# Patient Record
Sex: Female | Born: 1935 | Race: Black or African American | Hispanic: No | Smoking: Never smoker
Health system: Southern US, Community
[De-identification: ages and names within clinical notes are randomized; demographics above are authoritative.]

## PROBLEM LIST (undated history)

## (undated) DIAGNOSIS — K219 Gastro-esophageal reflux disease without esophagitis: Secondary | ICD-10-CM

## (undated) DIAGNOSIS — R4189 Other symptoms and signs involving cognitive functions and awareness: Secondary | ICD-10-CM

## (undated) DIAGNOSIS — I10 Essential (primary) hypertension: Secondary | ICD-10-CM

## (undated) DIAGNOSIS — C801 Malignant (primary) neoplasm, unspecified: Secondary | ICD-10-CM

## (undated) DIAGNOSIS — I2699 Other pulmonary embolism without acute cor pulmonale: Secondary | ICD-10-CM

## (undated) DIAGNOSIS — E119 Type 2 diabetes mellitus without complications: Secondary | ICD-10-CM

## (undated) HISTORY — PX: BREAST SURGERY: SHX581

---
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
## 2005-10-31 ENCOUNTER — Ambulatory Visit: Payer: Self-pay | Admitting: Internal Medicine

## 2005-11-12 ENCOUNTER — Ambulatory Visit: Payer: Self-pay | Admitting: Internal Medicine

## 2005-11-18 ENCOUNTER — Emergency Department: Payer: Self-pay | Admitting: Emergency Medicine

## 2005-11-18 ENCOUNTER — Other Ambulatory Visit: Payer: Self-pay

## 2006-02-13 ENCOUNTER — Ambulatory Visit: Payer: Self-pay | Admitting: Internal Medicine

## 2006-02-28 ENCOUNTER — Ambulatory Visit: Payer: Self-pay | Admitting: Internal Medicine

## 2006-03-12 ENCOUNTER — Ambulatory Visit: Payer: Self-pay | Admitting: Internal Medicine

## 2006-04-29 ENCOUNTER — Ambulatory Visit: Payer: Self-pay | Admitting: *Deleted

## 2006-05-12 ENCOUNTER — Ambulatory Visit: Payer: Self-pay | Admitting: *Deleted

## 2006-06-12 ENCOUNTER — Ambulatory Visit: Payer: Self-pay | Admitting: *Deleted

## 2006-06-25 ENCOUNTER — Ambulatory Visit: Payer: Self-pay | Admitting: Internal Medicine

## 2006-06-28 ENCOUNTER — Ambulatory Visit: Payer: Self-pay | Admitting: Internal Medicine

## 2006-07-13 ENCOUNTER — Ambulatory Visit: Payer: Self-pay | Admitting: *Deleted

## 2007-01-06 ENCOUNTER — Emergency Department: Payer: Self-pay | Admitting: Emergency Medicine

## 2007-01-11 ENCOUNTER — Ambulatory Visit: Payer: Self-pay | Admitting: Internal Medicine

## 2007-01-17 ENCOUNTER — Ambulatory Visit: Payer: Self-pay | Admitting: Internal Medicine

## 2007-02-11 ENCOUNTER — Ambulatory Visit: Payer: Self-pay | Admitting: Internal Medicine

## 2007-02-28 ENCOUNTER — Ambulatory Visit: Payer: Self-pay | Admitting: Internal Medicine

## 2007-03-09 ENCOUNTER — Inpatient Hospital Stay: Payer: Self-pay | Admitting: *Deleted

## 2007-03-13 ENCOUNTER — Ambulatory Visit: Payer: Self-pay | Admitting: Internal Medicine

## 2007-03-27 ENCOUNTER — Ambulatory Visit: Payer: Self-pay | Admitting: Obstetrics and Gynecology

## 2007-07-14 ENCOUNTER — Ambulatory Visit: Payer: Self-pay | Admitting: Internal Medicine

## 2007-07-16 ENCOUNTER — Ambulatory Visit: Payer: Self-pay | Admitting: Internal Medicine

## 2007-08-13 ENCOUNTER — Ambulatory Visit: Payer: Self-pay | Admitting: Internal Medicine

## 2007-09-04 ENCOUNTER — Ambulatory Visit: Payer: Self-pay | Admitting: Unknown Physician Specialty

## 2008-01-11 ENCOUNTER — Ambulatory Visit: Payer: Self-pay | Admitting: Internal Medicine

## 2008-02-11 ENCOUNTER — Ambulatory Visit: Payer: Self-pay | Admitting: Internal Medicine

## 2008-02-25 ENCOUNTER — Ambulatory Visit: Payer: Self-pay | Admitting: Unknown Physician Specialty

## 2008-03-01 ENCOUNTER — Ambulatory Visit: Payer: Self-pay | Admitting: Internal Medicine

## 2008-03-12 ENCOUNTER — Ambulatory Visit: Payer: Self-pay | Admitting: Internal Medicine

## 2008-07-13 ENCOUNTER — Ambulatory Visit: Payer: Self-pay | Admitting: Internal Medicine

## 2008-07-20 ENCOUNTER — Ambulatory Visit: Payer: Self-pay | Admitting: Internal Medicine

## 2008-08-12 ENCOUNTER — Ambulatory Visit: Payer: Self-pay | Admitting: Internal Medicine

## 2008-08-18 ENCOUNTER — Ambulatory Visit: Payer: Self-pay | Admitting: Otolaryngology

## 2008-08-22 IMAGING — CR DG CHEST 2V
1 series · 2 of 2 positions shown · non-contrast
Comparison: none

REASON FOR EXAM: CP - RM 4
COMMENTS:

[Series 1: view not recorded · 0.17mm/px · 2 of 2 slices shown]
[im 1/2]
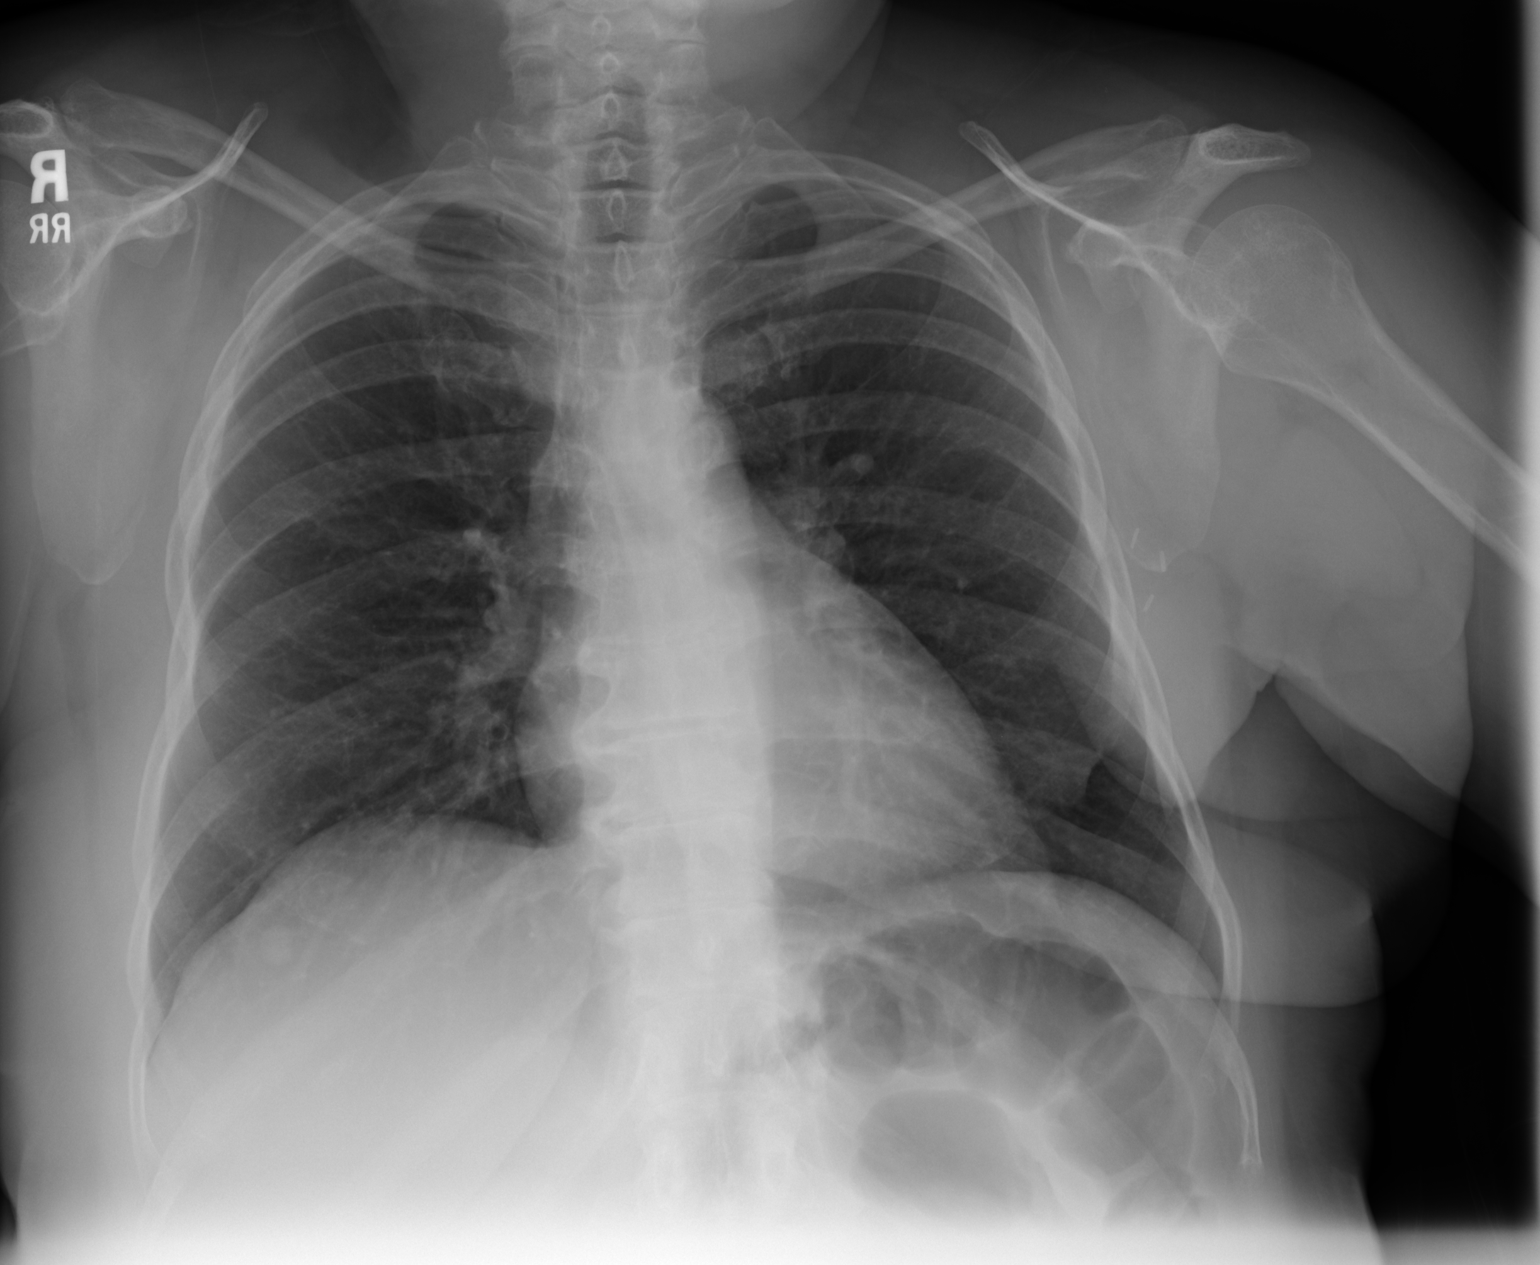
[im 2/2]
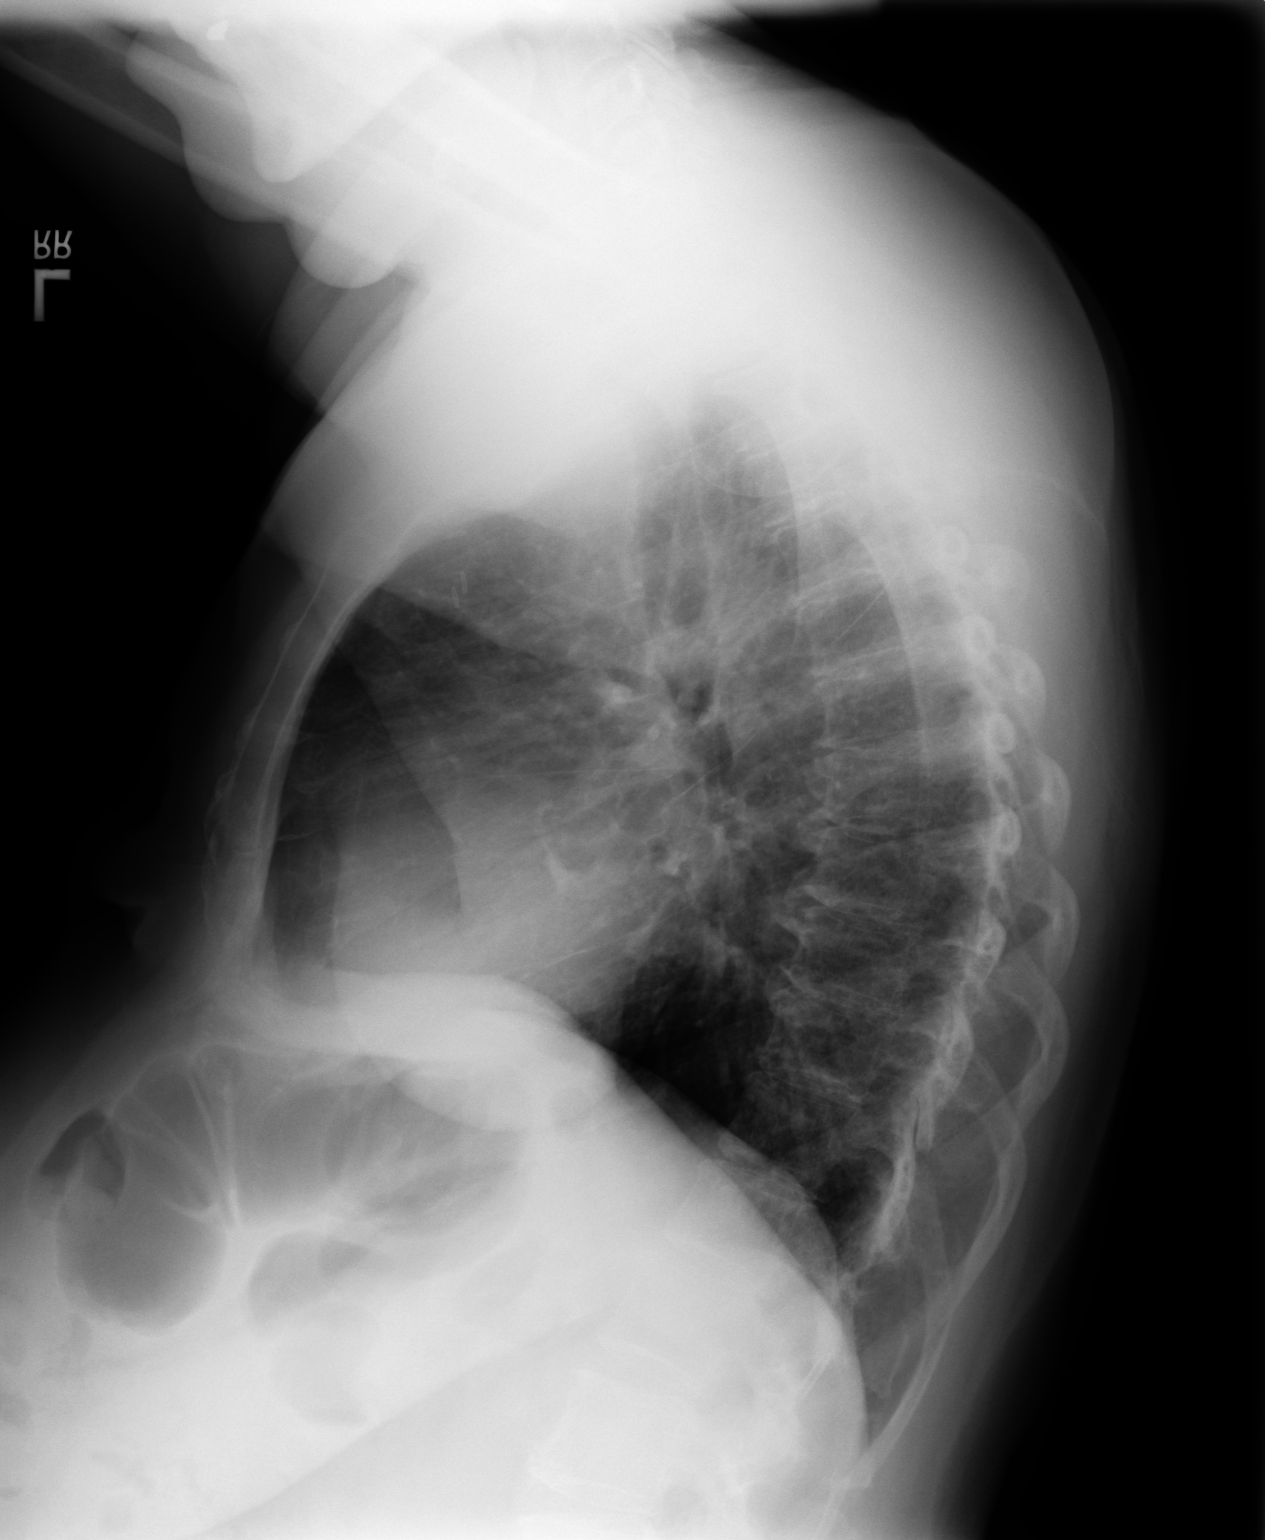

[2 of 2 positions shown; findings below may reference images not displayed]

PROCEDURE:     DXR - DXR CHEST PA (OR AP) AND LATERAL  - March 09, 2007 [DATE]

RESULT:     Comparison is made to prior study dated 11-19-05.

The lungs are clear. The cardiac silhouette and visualized bony skeleton are
unremarkable.  Chest radiograph without evidence of acute cardiopulmonary
disease.
IMPRESSION: 1)Unremarkable chest radiograph.

## 2009-01-10 ENCOUNTER — Ambulatory Visit: Payer: Self-pay | Admitting: Internal Medicine

## 2009-01-31 ENCOUNTER — Ambulatory Visit: Payer: Self-pay | Admitting: Internal Medicine

## 2009-02-10 ENCOUNTER — Ambulatory Visit: Payer: Self-pay | Admitting: Internal Medicine

## 2009-02-16 ENCOUNTER — Ambulatory Visit: Payer: Self-pay | Admitting: Family Medicine

## 2009-02-26 ENCOUNTER — Emergency Department: Payer: Self-pay | Admitting: Emergency Medicine

## 2009-03-09 ENCOUNTER — Ambulatory Visit: Payer: Self-pay | Admitting: Internal Medicine

## 2009-03-12 ENCOUNTER — Ambulatory Visit: Payer: Self-pay | Admitting: Internal Medicine

## 2009-07-13 ENCOUNTER — Ambulatory Visit: Payer: Self-pay | Admitting: Internal Medicine

## 2009-08-01 ENCOUNTER — Ambulatory Visit: Payer: Self-pay | Admitting: Internal Medicine

## 2009-08-12 ENCOUNTER — Ambulatory Visit: Payer: Self-pay | Admitting: Internal Medicine

## 2010-02-09 ENCOUNTER — Ambulatory Visit: Payer: Self-pay | Admitting: Family Medicine

## 2010-03-12 ENCOUNTER — Ambulatory Visit: Payer: Self-pay | Admitting: Internal Medicine

## 2010-03-13 ENCOUNTER — Ambulatory Visit: Payer: Self-pay | Admitting: Internal Medicine

## 2010-03-22 ENCOUNTER — Ambulatory Visit: Payer: Self-pay | Admitting: Internal Medicine

## 2010-04-12 ENCOUNTER — Ambulatory Visit: Payer: Self-pay | Admitting: Internal Medicine

## 2010-10-17 ENCOUNTER — Ambulatory Visit: Payer: Self-pay | Admitting: Allergy and Immunology

## 2013-03-25 ENCOUNTER — Ambulatory Visit: Payer: Self-pay | Admitting: Family Medicine

## 2013-06-11 ENCOUNTER — Ambulatory Visit: Payer: Self-pay | Admitting: Unknown Physician Specialty

## 2013-06-12 LAB — PATHOLOGY REPORT

## 2014-02-11 ENCOUNTER — Emergency Department: Payer: Self-pay | Admitting: Emergency Medicine

## 2014-07-01 ENCOUNTER — Ambulatory Visit: Payer: Self-pay | Admitting: Family Medicine

## 2016-02-27 ENCOUNTER — Emergency Department
Admission: EM | Admit: 2016-02-27 | Discharge: 2016-02-27 | Disposition: A | Discharge status: Home / Self Care | Payer: Medicare Other | Attending: Student | Admitting: Student

## 2016-02-27 ENCOUNTER — Encounter: Payer: Self-pay | Admitting: Emergency Medicine

## 2016-02-27 ENCOUNTER — Emergency Department: Payer: Medicare Other

## 2016-02-27 DIAGNOSIS — E119 Type 2 diabetes mellitus without complications: Secondary | ICD-10-CM | POA: Insufficient documentation

## 2016-02-27 DIAGNOSIS — R413 Other amnesia: Secondary | ICD-10-CM | POA: Insufficient documentation

## 2016-02-27 DIAGNOSIS — I10 Essential (primary) hypertension: Secondary | ICD-10-CM | POA: Insufficient documentation

## 2016-02-27 HISTORY — DX: Essential (primary) hypertension: I10

## 2016-02-27 HISTORY — DX: Gastro-esophageal reflux disease without esophagitis: K21.9

## 2016-02-27 HISTORY — DX: Type 2 diabetes mellitus without complications: E11.9

## 2016-02-27 LAB — URINALYSIS COMPLETE WITH MICROSCOPIC (ARMC ONLY)
BACTERIA UA: NONE SEEN
Bilirubin Urine: NEGATIVE
Glucose, UA: >500 mg/dL — AB
HGB URINE DIPSTICK: NEGATIVE
NITRITE: NEGATIVE
PH: 5 (ref 5.0–8.0)
PROTEIN: 30 mg/dL — AB
SPECIFIC GRAVITY, URINE: 1.024 (ref 1.005–1.030)

## 2016-02-27 LAB — BASIC METABOLIC PANEL
ANION GAP: 6 (ref 5–15)
BUN: 17 mg/dL (ref 6–20)
CALCIUM: 8.9 mg/dL (ref 8.9–10.3)
CO2: 26 mmol/L (ref 22–32)
Chloride: 105 mmol/L (ref 101–111)
Creatinine, Ser: 0.88 mg/dL (ref 0.44–1.00)
GFR calc Af Amer: >60 mL/min (ref >60)
GFR calc non Af Amer: >60 mL/min (ref >60)
GLUCOSE: 266 mg/dL — AB (ref 65–99)
Potassium: 3.7 mmol/L (ref 3.5–5.1)
Sodium: 137 mmol/L (ref 135–145)

## 2016-02-27 LAB — CBC
HCT: 37.3 % (ref 35.0–47.0)
HEMOGLOBIN: 12.2 g/dL (ref 12.0–16.0)
MCH: 25.8 pg — AB (ref 26.0–34.0)
MCHC: 32.7 g/dL (ref 32.0–36.0)
MCV: 78.8 fL — ABNORMAL LOW (ref 80.0–100.0)
Platelets: 185 10*3/uL (ref 150–440)
RBC: 4.73 MIL/uL (ref 3.80–5.20)
RDW: 15.9 % — ABNORMAL HIGH (ref 11.5–14.5)
WBC: 6.1 10*3/uL (ref 3.6–11.0)

## 2016-02-27 NOTE — ED Notes (Signed)
Pt presents with daughter which is from out of town. Daughter reports that her mother has not been taking her meds as directed and is afraid that she may be developing some memory loss. Pt denies any chest pain and has no other symptoms at this time. Pt a/ox3 in triage. No acute distress noted.

## 2016-02-27 NOTE — ED Provider Notes (Signed)
[name] Provider Note  ____________________________________________  Time seen: Approximately 1:00 PM  I have reviewed the triage vital signs and the nursing notes.   HISTORY  Chief Complaint Memory Loss    HPI Dana Kelley is a 82 y.o. female issue of diabetes, hypertension, GERD, hypothyroidism, hyperlipidemia who presents for evaluation of greater than one month of memory difficulties, gradual onset, intermittent, currently mild to moderate, no modifying factors. The patient and her daughter at bedside are concerned that she may have early dementia and brought her to the ER so that she could be evaluated. Patient reports that she has had difficulty remembering things as of late, she will be driving and become confused about her intended destination. She has stopped taking all her medications because she "can't remember to take them". No chest pain, difficulty breathing, coughing, sneezing, runny nose, congestion, vomiting, diarrhea, fevers or chills. No headaches, vision changes, numbness or weakness. She does have a family history of dementia.   Past Medical History  Diagnosis Date  . Diabetes mellitus without complication ([place])   . Hypertension   . GERD (gastroesophageal reflux disease)     There are no active problems to display for this patient.   History reviewed. No pertinent past surgical history.  No current outpatient prescriptions on file.  Allergies Review of patient's allergies indicates no known allergies.  No family history on file.  Social History Social History  Substance Use Topics  . Smoking status: Never Smoker   . Smokeless tobacco: None  . Alcohol Use: No    Review of Systems Constitutional: No fever/chills Eyes: No visual changes. ENT: No sore throat. Cardiovascular: Denies chest pain. Respiratory: Denies shortness of breath. Gastrointestinal: No abdominal pain.  No nausea, no vomiting.  No  diarrhea.  No constipation. Genitourinary: Negative for dysuria. Musculoskeletal: Negative for back pain. Skin: Negative for rash. Neurological: Negative for headaches, focal weakness or numbness.  10-point ROS otherwise negative.  ____________________________________________   PHYSICAL EXAM:  VITAL SIGNS: ED Triage Vitals  Enc Vitals Group     BP 02/27/16 1121 118/98 mmHg     Pulse Rate 02/27/16 1121 78     Resp 02/27/16 1121 20     Temp 02/27/16 1121 98.6 F (37 C)     Temp Source 02/27/16 1121 Oral     SpO2 02/27/16 1121 98 %     Weight 02/27/16 1122 200 lb (90.719 kg)     Height 02/27/16 1122 5\' 1"  (1.549 m)     Head Cir --      Peak Flow --      Pain Score --      Pain Loc --      Pain Edu? --      Excl. in [place]? --     Constitutional: Alert and oriented x 3. Well appearing and in no acute distress. Eyes: Conjunctivae are normal. PERRL. EOMI. Head: Atraumatic. Nose: No congestion/rhinnorhea. Mouth/Throat: Mucous membranes are moist.  Oropharynx non-erythematous. Neck: No stridor.  Supple without meningismus. Cardiovascular: Normal rate, regular rhythm. Grossly normal heart sounds.  Good peripheral circulation. Respiratory: Normal respiratory effort.  No retractions. Lungs CTAB. Gastrointestinal: Soft and nontender. No distention.  No CVA tenderness. Genitourinary: deferred Musculoskeletal: No lower extremity tenderness nor edema.  No joint effusions. Neurologic:  Normal speech and language. No gross focal neurologic deficits are appreciated. No gait instability. 5 out of 5 strength in bilateral upper and lower extremities, sensation intact to light touch throughout, cranial  nerves II through XII intact, normal finger-nose-finger. Skin:  Skin is warm, dry and intact. No rash noted. Psychiatric: Mood and affect are normal. Speech and behavior are normal.  ____________________________________________   LABS (all labs ordered are listed, but only abnormal results are  displayed)  Labs Reviewed  BASIC METABOLIC PANEL - Abnormal; Notable for the following:    Glucose, Bld 266 (*)    All other components within normal limits  CBC - Abnormal; Notable for the following:    MCV 78.8 (*)    MCH 25.8 (*)    RDW 15.9 (*)    All other components within normal limits  URINALYSIS COMPLETEWITH MICROSCOPIC ([facility] ONLY) - Abnormal; Notable for the following:    Color, Urine YELLOW (*)    APPearance CLEAR (*)    Glucose, UA >500 (*)    Ketones, ur TRACE (*)    Protein, ur 30 (*)    Leukocytes, UA TRACE (*)    Squamous Epithelial / LPF 6-30 (*)    All other components within normal limits   ____________________________________________  EKG  ED ECG REPORT I, Joanne Gavel, the attending physician, personally viewed and interpreted this ECG.   Date: 02/27/2016  EKG Time: 11:35  Rate: 68  Rhythm: normal sinus rhythm  Axis: normal  Intervals:none  ST&T Change: No acute ST elevation. Q waves in 1, 2, aVL, aVF, V2, V3.  ____________________________________________  RADIOLOGY  CT head IMPRESSION: Mild chronic atrophy and ischemic changes. No acute abnormality is noted. ____________________________________________   PROCEDURES  Procedure(s) performed: None  Critical Care performed: No  ____________________________________________   INITIAL IMPRESSION / ASSESSMENT AND PLAN / ED COURSE  Pertinent labs & imaging results that were available during my care of the patient were reviewed by me and considered in my medical decision making (see chart for details).  Dana Kelley is a 82 y.o. female issue of diabetes, hypertension, GERD, hypothyroidism, hyperlipidemia who presents for evaluation of greater than one month of memory difficulties, gradual onset, intermittent, currently mild to moderate, no modifying factors.  On exam, she is well-appearing and in no acute distress. Vital signs are stable, she is afebrile. She has an intact neurological  examination, she is awake, alert and oriented. EKG not consistent with acute ischemia. Basic labs and urinalysis are generally unremarkable. CT head shows no acute intracranial abnormality. I discussed with the patient as well as her daughter at bedside that the diagnosis of dementia cannot be made during one ER visit. We discussed return precautions, need for close PCP follow-up as well as the need for possible evaluation by neurocognitive specialist. They voice understanding and are comfortable with the discharge plan. DC home.  ____________________________________________   FINAL CLINICAL IMPRESSION(S) / ED DIAGNOSES  Final diagnoses:  Memory loss      Joanne Gavel, MD 02/27/16 1425

## 2017-08-12 IMAGING — CT CT HEAD W/O CM
1 series · 16 of 30 positions shown, 20 images · non-contrast
Comparison: None.

CLINICAL DATA: Left leg weakness

EXAM:
CT HEAD WITHOUT CONTRAST
TECHNIQUE: Contiguous axial images were obtained from the base of the skull
through the vertex without intravenous contrast.

[Series 2: head wo · axial · 0.47mm/px · z∈[-138,-5]mm · 16 of 30 slices shown, 20 images]
[im 2/30  brain]
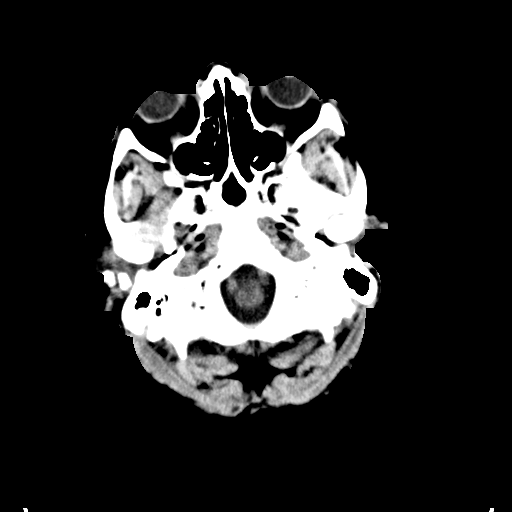
[im 2/30  bone]
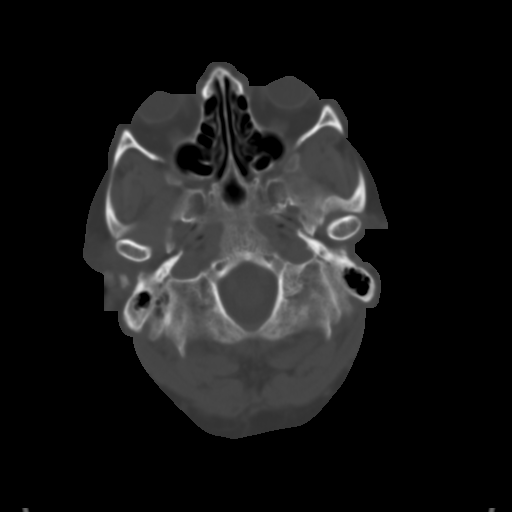
[im 4/30  brain]
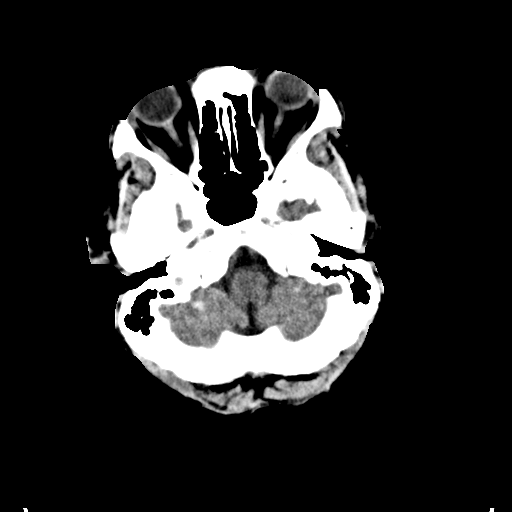
[im 6/30  brain]
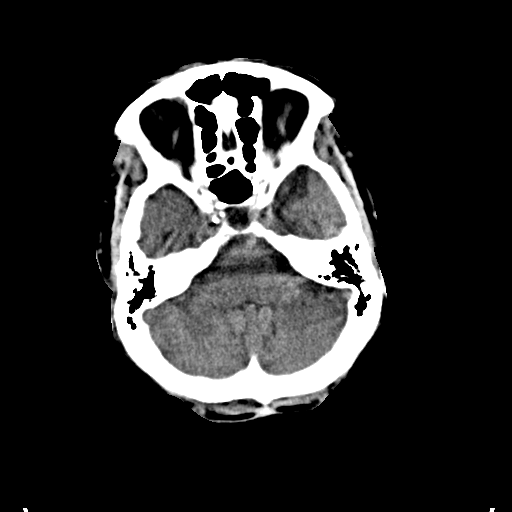
[im 8/30  brain]
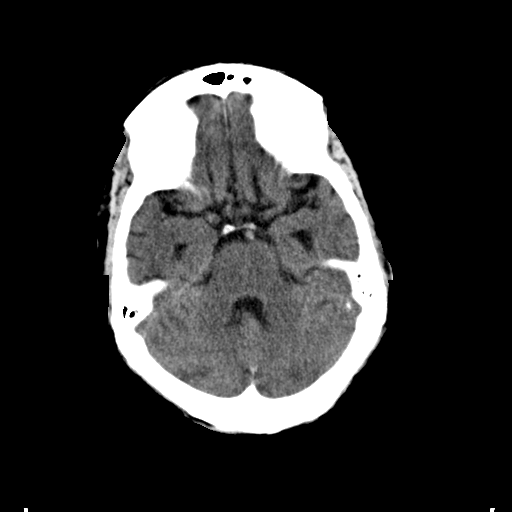
[im 9/30  brain]
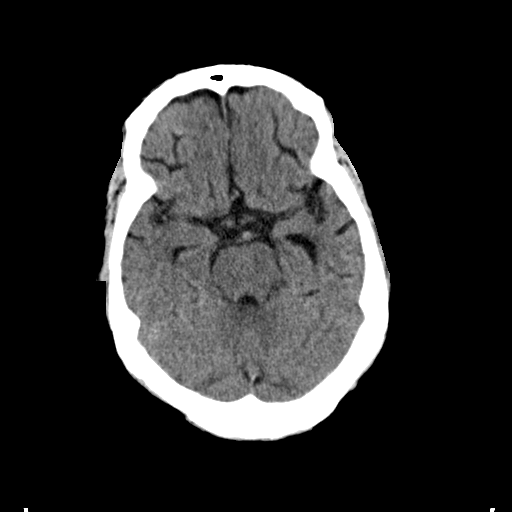
[im 9/30  bone]
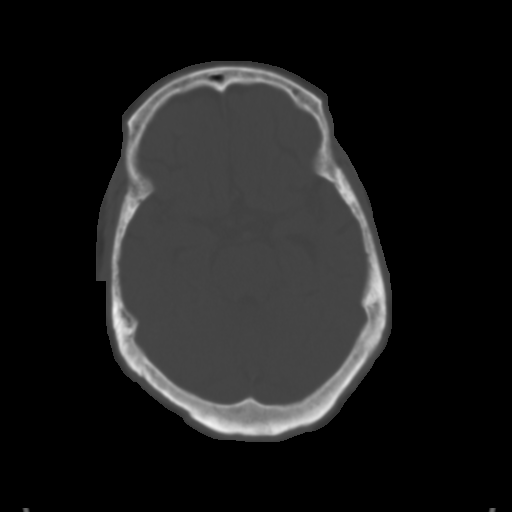
[im 11/30  brain]
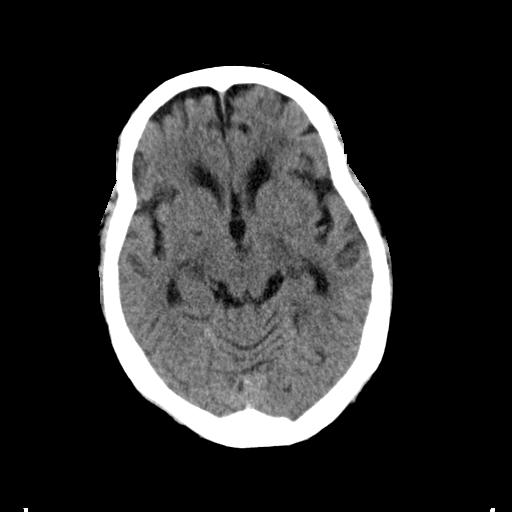
[im 13/30  brain]
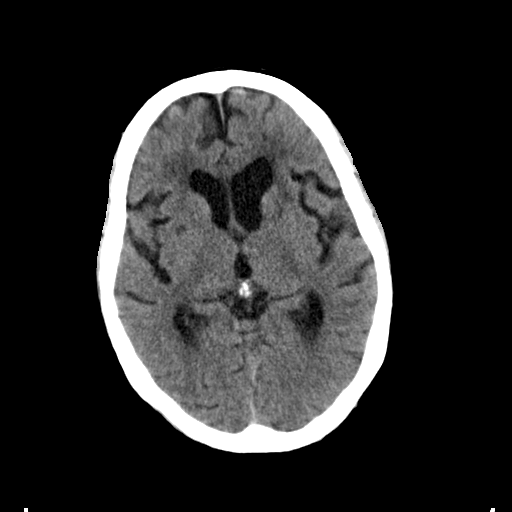
[im 15/30  brain]
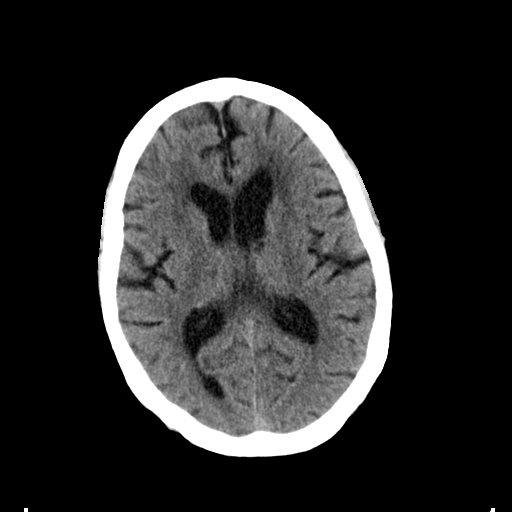
[im 16/30  brain]
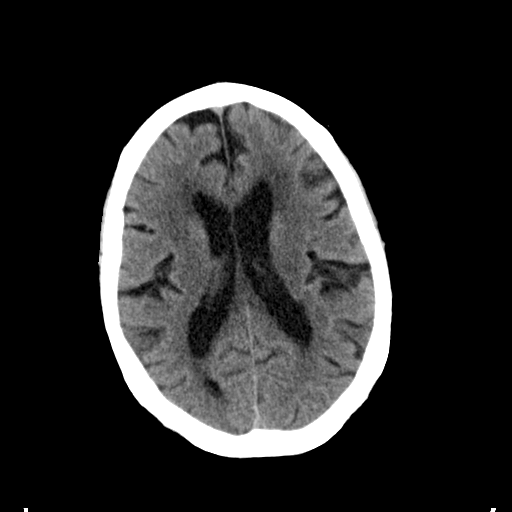
[im 16/30  bone]
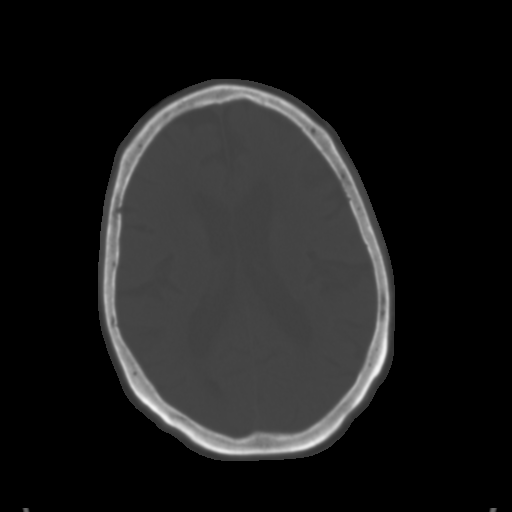
[im 18/30  brain]
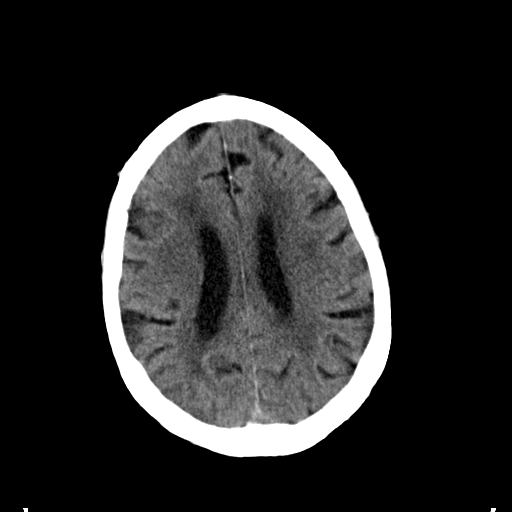
[im 20/30  brain]
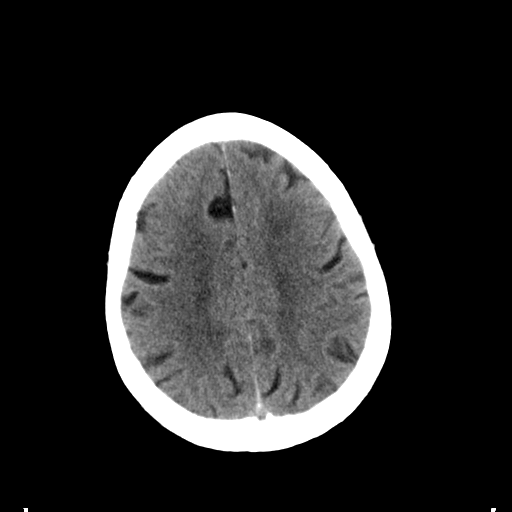
[im 22/30  brain]
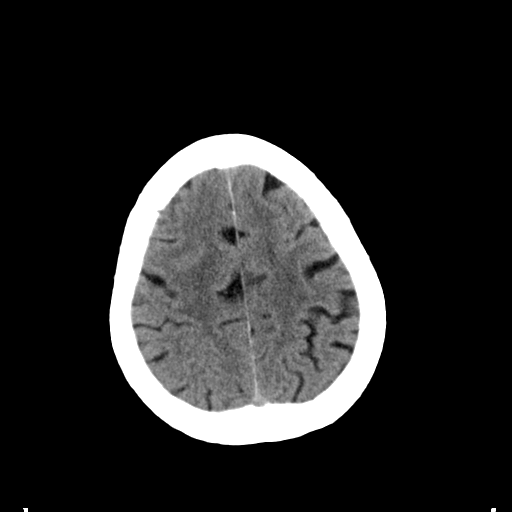
[im 23/30  brain]
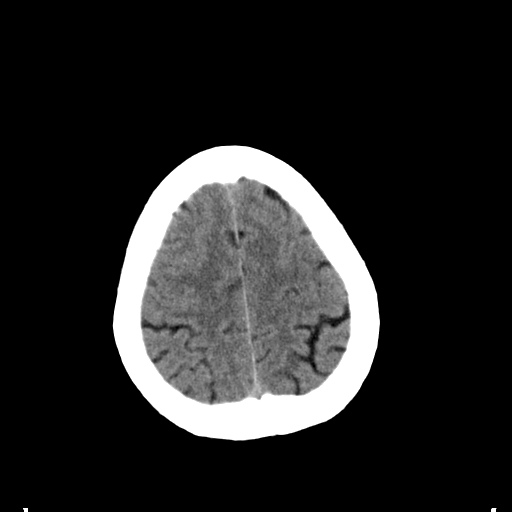
[im 23/30  bone]
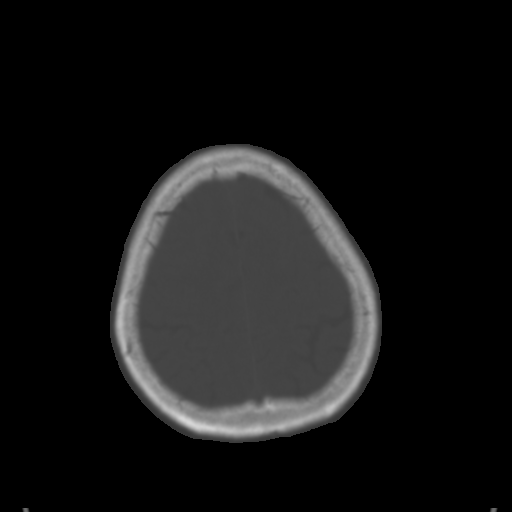
[im 25/30  brain]
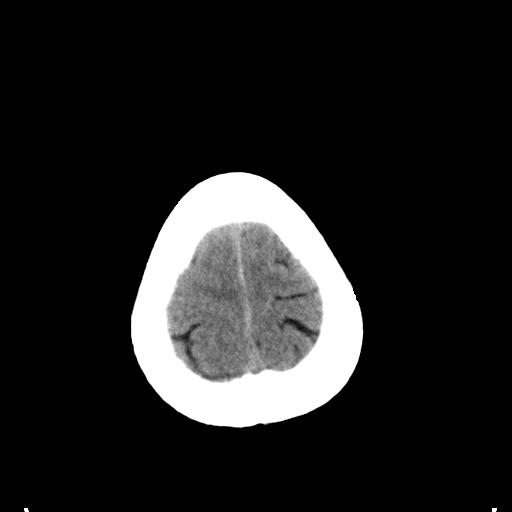
[im 27/30  brain]
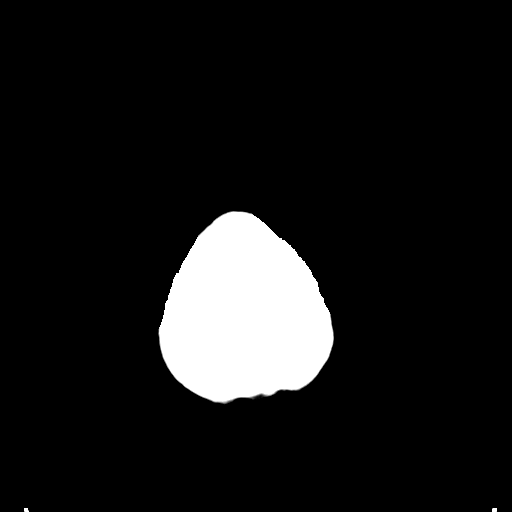
[im 29/30  brain]
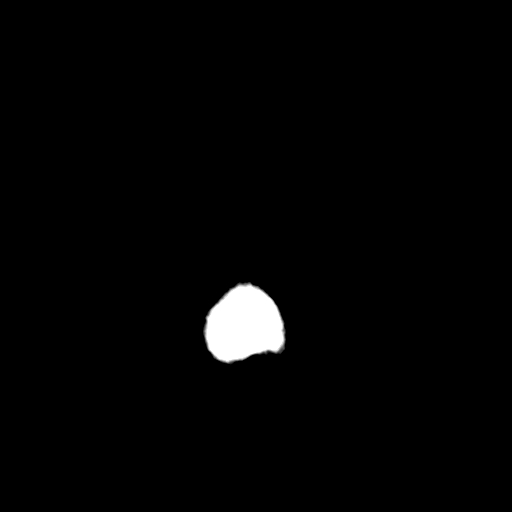

[16 of 30 positions shown; findings below may reference images not displayed]

FINDINGS: The bony calvarium is intact. Mild atrophic changes and chronic
white matter ischemic change is noted. Small lacunar infarcts is
noted on the right in the subinsular region. No findings to suggest
acute hemorrhage, acute infarction or space-occupying mass lesion
are noted.
IMPRESSION: Mild chronic atrophy and ischemic changes. No acute abnormality is
noted.

## 2019-02-11 DIAGNOSIS — I2699 Other pulmonary embolism without acute cor pulmonale: Secondary | ICD-10-CM

## 2019-02-11 HISTORY — DX: Other pulmonary embolism without acute cor pulmonale: I26.99

## 2019-06-20 ENCOUNTER — Emergency Department: Payer: Medicare Other

## 2019-06-20 ENCOUNTER — Emergency Department
Admission: EM | Admit: 2019-06-20 | Discharge: 2019-06-20 | Disposition: A | Discharge status: Home / Self Care | Payer: Medicare Other | Attending: Emergency Medicine | Admitting: Emergency Medicine

## 2019-06-20 ENCOUNTER — Other Ambulatory Visit: Payer: Self-pay

## 2019-06-20 ENCOUNTER — Encounter: Payer: Self-pay | Admitting: Emergency Medicine

## 2019-06-20 DIAGNOSIS — IMO0001 Reserved for inherently not codable concepts without codable children: Secondary | ICD-10-CM

## 2019-06-20 DIAGNOSIS — I10 Essential (primary) hypertension: Secondary | ICD-10-CM | POA: Diagnosis not present

## 2019-06-20 DIAGNOSIS — Y929 Unspecified place or not applicable: Secondary | ICD-10-CM | POA: Insufficient documentation

## 2019-06-20 DIAGNOSIS — R2242 Localized swelling, mass and lump, left lower limb: Secondary | ICD-10-CM | POA: Diagnosis present

## 2019-06-20 DIAGNOSIS — S93402A Sprain of unspecified ligament of left ankle, initial encounter: Secondary | ICD-10-CM | POA: Insufficient documentation

## 2019-06-20 DIAGNOSIS — Y999 Unspecified external cause status: Secondary | ICD-10-CM | POA: Insufficient documentation

## 2019-06-20 DIAGNOSIS — X58XXXA Exposure to other specified factors, initial encounter: Secondary | ICD-10-CM | POA: Insufficient documentation

## 2019-06-20 DIAGNOSIS — M25472 Effusion, left ankle: Secondary | ICD-10-CM

## 2019-06-20 DIAGNOSIS — E119 Type 2 diabetes mellitus without complications: Secondary | ICD-10-CM | POA: Insufficient documentation

## 2019-06-20 DIAGNOSIS — Y939 Activity, unspecified: Secondary | ICD-10-CM | POA: Diagnosis not present

## 2019-06-20 HISTORY — DX: Malignant (primary) neoplasm, unspecified: C80.1

## 2019-06-20 HISTORY — DX: Other symptoms and signs involving cognitive functions and awareness: R41.89

## 2019-06-20 MED ORDER — ACETAMINOPHEN 500 MG PO TABS: 1000.0000 mg | ORAL_TABLET | Freq: Once | ORAL | Status: DC

## 2019-06-20 NOTE — Discharge Instructions (Addendum)
IT IS VERY IMPORTANT THAT YOU WALK, OR AT LEAST MOVE YOUR LEGS, AS OFTEN AS POSSIBLE, AT LEAST ONCE EVERY 1-2 HOURS  DOING THIS WILL HELP WITH SWELLING, IMPROVE PAIN, AND PREVENT BLOOD CLOTS  FOLLOW-UP WITH YOUR REGULAR DOCTOR IN 1 WEEK FOR REPEAT EXAM AND IMAGING  TAKE OVER-THE-COUNTER TYLENOL AND ADVIL AS NEEDED FOR PAIN  YOU CAN APPLY ICE AS NEEDED FOR SWELLING

## 2019-06-20 NOTE — ED Triage Notes (Signed)
Pt to ED from home c/o left leg swelling, dx PE back in April and placed on elequis.  Seen at [facility] today but sent here for [place] of left leg.  States pain to left ankle that is swollen but denies SOB.  Speaking in complete and coherent sentences, chest rise even and unlabored, in NAD at this time.

## 2019-06-20 NOTE — ED Notes (Signed)
First Nurse Note: Pt to ED from [facility] for evaluation of DVT. Pt has hx/o Dementia. Pt Dtr present with her.

## 2019-06-20 NOTE — ED Provider Notes (Signed)
[facility] Emergency Department Provider Note  ____________________________________________   First MD Initiated Contact with Patient 06/20/19 1609     (approximate)  I have reviewed the triage vital signs and the nursing notes.   HISTORY  Chief Complaint Leg Swelling    HPI Dana Kelley is a 85 y.o. female  Here with left ankle swelling. Per pt's report, she has had increasing L ankle pain and swelling for the last week. Lives with her daughter who is here. Per family report, pt has a known R meniscal injury, but has been c/o increasing L ankle pain x 1-2 weeks. She's been less mobile and they believe this was the cause, and the swelling does improve w/ elevation. However, pt was c/o increased pain todasy so they went to UC and were sent here. No known trauma. No redness or skin break down or wounds. No other complaints. She uses a cane occasionally. No fever, chills. No h/o DVT. No popliteal TTP.        Past Medical History:  Diagnosis Date  . Cancer ([place])    Breast (left)  . Cognitive decline   . Diabetes mellitus without complication ([place])   . GERD (gastroesophageal reflux disease)   . Hypertension   . Pulmonary embolism ([place]) 02/2019    There are no active problems to display for this patient.   Past Surgical History:  Procedure Laterality Date  . BREAST SURGERY Left     Prior to Admission medications   Not on File    Allergies Januvia [sitagliptin]  History reviewed. No pertinent family history.  Social History Social History   Tobacco Use  . Smoking status: Never Smoker  . Smokeless tobacco: Never Used  Substance Use Topics  . Alcohol use: No  . Drug use: Never    Review of Systems  Review of Systems  Constitutional: Negative for fatigue and fever.  HENT: Negative for congestion and sore throat.   Eyes: Negative for visual disturbance.  Respiratory: Negative for cough and shortness of breath.   Cardiovascular:  Negative for chest pain.  Gastrointestinal: Negative for abdominal pain, diarrhea, nausea and vomiting.  Genitourinary: Negative for flank pain.  Musculoskeletal: Positive for arthralgias and myalgias. Negative for back pain and neck pain.  Skin: Negative for rash and wound.  Neurological: Negative for weakness.  All other systems reviewed and are negative.    ____________________________________________  PHYSICAL EXAM:      VITAL SIGNS: ED Triage Vitals  Enc Vitals Group     BP 06/20/19 1320 (!) 127/57     Pulse Rate 06/20/19 1320 (!) 57     Resp 06/20/19 1320 15     Temp 06/20/19 1320 99.9 F (37.7 C)     Temp Source 06/20/19 1320 Oral     SpO2 06/20/19 1320 100 %     Weight 06/20/19 1359 159 lb (72.1 kg)     Height 06/20/19 1359 5' (1.524 m)     Head Circumference --      Peak Flow --      Pain Score 06/20/19 1358 2     Pain Loc --      Pain Edu? --      Excl. in [place]? --      Physical Exam Vitals signs and nursing note reviewed.  Constitutional:      General: She is not in acute distress.    Appearance: She is well-developed.  HENT:     Head: Normocephalic and atraumatic.  Eyes:     Conjunctiva/sclera: Conjunctivae normal.  Neck:     Musculoskeletal: Neck supple.  Cardiovascular:     Rate and Rhythm: Normal rate and regular rhythm.     Heart sounds: Normal heart sounds. No murmur. No friction rub.  Pulmonary:     Effort: Pulmonary effort is normal. No respiratory distress.     Breath sounds: Normal breath sounds. No wheezing or rales.  Abdominal:     General: There is no distension.     Palpations: Abdomen is soft.     Tenderness: There is no abdominal tenderness.  Skin:    General: Skin is warm.     Capillary Refill: Capillary refill takes less than 2 seconds.  Neurological:     Mental Status: She is alert and oriented to person, place, and time.     Motor: No abnormal muscle tone.      LOWER EXTREMITY EXAM: LEFT  INSPECTION & PALPATION: Moderate  edema of L ankle, no open wounds. Moderate bruising noted to lateral ankle.  SENSORY: sensation is intact to light touch in:  Superficial peroneal nerve distribution (over dorsum of foot) Deep peroneal nerve distribution (over first dorsal web space) Sural nerve distribution (over lateral aspect 5th metatarsal) Saphenous nerve distribution (over medial instep)  MOTOR:  + Motor EHL (great toe dorsiflexion) + FHL (great toe plantar flexion)  + TA (ankle dorsiflexion)  + GSC (ankle plantar flexion)  VASCULAR: 2+ dorsalis pedis and posterior tibialis pulses Capillary refill < 2 sec, toes warm and well-perfused  COMPARTMENTS: Soft, warm, well-perfused No pain with passive extension No parethesias    ____________________________________________   LABS (all labs ordered are listed, but only abnormal results are displayed)  Labs Reviewed - No data to display  ____________________________________________  EKG: None ________________________________________  RADIOLOGY All imaging, including plain films, CT scans, and ultrasounds, independently reviewed by me, and interpretations confirmed via formal radiology reads.  ED MD interpretation:   U/S: No DVT Plain films: soft tissue swelling, no fx  Official radiology report(s): Dg Ankle Complete Left  Result Date: 06/20/2019 CLINICAL DATA:  Pain without trauma.  Swelling. EXAM: LEFT ANKLE COMPLETE - 3+ VIEW COMPARISON:  None. FINDINGS: There is bilateral soft tissue swelling in the ankle. No fracture or dislocation identified. Mild degenerative changes are noted. IMPRESSION: Mild degenerative changes. Soft tissue swelling. No fractures or bony lesions identified. No bony erosion. Electronically Signed   By: Dorise Bullion III M.D   On: 06/20/2019 17:06   US Venous Img Lower Unilateral Left  Result Date: 06/20/2019 CLINICAL DATA:  Left ankle edema. EXAM: LEFT LOWER EXTREMITY VENOUS DOPPLER ULTRASOUND TECHNIQUE: Gray-scale sonography  with graded compression, as well as color Doppler and duplex ultrasound were performed to evaluate the lower extremity deep venous systems from the level of the common femoral vein and including the common femoral, femoral, profunda femoral, popliteal and calf veins including the posterior tibial, peroneal and gastrocnemius veins when visible. The superficial great saphenous vein was also interrogated. Spectral Doppler was utilized to evaluate flow at rest and with distal augmentation maneuvers in the common femoral, femoral and popliteal veins. COMPARISON:  None. FINDINGS: Contralateral Common Femoral Vein: Respiratory phasicity is normal and symmetric with the symptomatic side. No evidence of thrombus. Normal compressibility. Common Femoral Vein: No evidence of thrombus. Normal compressibility, respiratory phasicity and response to augmentation. Saphenofemoral Junction: No evidence of thrombus. Normal compressibility and flow on color Doppler imaging. Profunda Femoral Vein: No evidence of thrombus. Normal compressibility and flow  on color Doppler imaging. Femoral Vein: No evidence of thrombus. Normal compressibility, respiratory phasicity and response to augmentation. Popliteal Vein: No evidence of thrombus. Normal compressibility, respiratory phasicity and response to augmentation. Calf Veins: No evidence of thrombus. Normal compressibility and flow on color Doppler imaging. Superficial Great Saphenous Vein: No evidence of thrombus. Normal compressibility. Venous Reflux:  None. Other Findings: No evidence of superficial thrombophlebitis or abnormal fluid collection. IMPRESSION: No evidence of left lower extremity deep venous thrombosis. Electronically Signed   By: Aletta Edouard M.D.   On: 06/20/2019 14:55    ____________________________________________  PROCEDURES   Procedure(s) performed (including Critical Care):  Procedures  ____________________________________________  INITIAL IMPRESSION / MDM  / [place] / ED COURSE  As part of my medical decision making, I reviewed the following data within the electronic MEDICAL RECORD NUMBER Notes from prior ED visits and [place] Controlled Substance Database      *AERIAL DILLEY was evaluated in Emergency Department on 06/20/2019 for the symptoms described in the history of present illness. She was evaluated in the context of the global COVID-19 pandemic, which necessitated consideration that the patient might be at risk for infection with the SARS-CoV-2 virus that causes COVID-19. Institutional protocols and algorithms that pertain to the evaluation of patients at risk for COVID-19 are in a state of rapid change based on information released by regulatory bodies including the CDC and federal and state organizations. These policies and algorithms were followed during the patient's care in the ED.  Some ED evaluations and interventions may be delayed as a result of limited staffing during the pandemic.*   Clinical Course as of Jun 19 1722  Sat Jun 20, 2019  1653 85 yo F here with L ankle pain and swelling. Plain film neg, u/S neg for DVT. Distal pulses and neurological status are fully intact. I suspect she likely has ankle sprain 2/2 gait changes from walking due to R meniscal injury, which is known. Stirrup splint applied, importance of ambulating reiterated, d/c with tylenol, outpt follow-up.   [CI]    Clinical Course User Index [CI] Duffy Bruce, MD    Medical Decision Making: As above  ____________________________________________  FINAL CLINICAL IMPRESSION(S) / ED DIAGNOSES  Final diagnoses:  First degree ankle sprain, left, initial encounter  Left ankle swelling     MEDICATIONS GIVEN DURING THIS VISIT:  Medications  acetaminophen (TYLENOL) tablet 1,000 mg (has no administration in time range)     ED Discharge Orders    None       Note:  This document was prepared using Dragon voice recognition software and may include  unintentional dictation errors.   Duffy Bruce, MD 06/20/19 1723

## 2020-12-03 IMAGING — US VENOUS DOPPLER ULTRASOUND OF LEFT LOWER EXTREMITY
1 series · 13 of 24 positions shown · non-contrast
Comparison: None.

CLINICAL DATA: Left ankle edema.



[Series 1: venous doppler ultrasound of left lower extremity · 0.07mm/px · 13 of 34 slices shown]
[im 1/34]
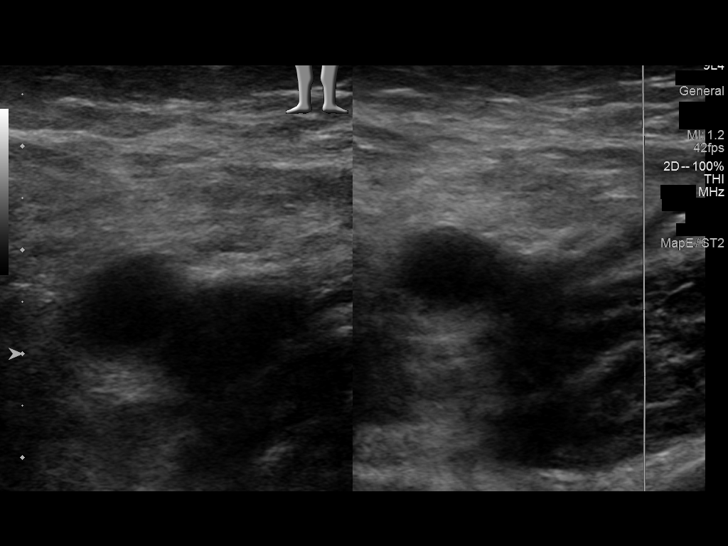
[im 3/34]
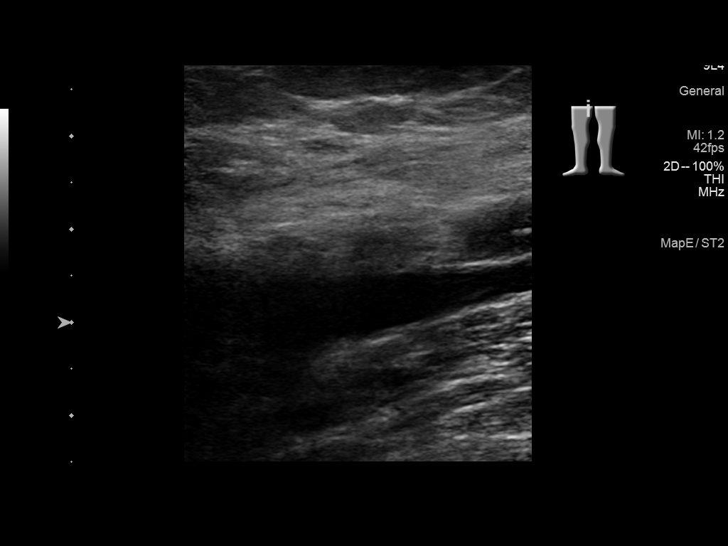
[im 6/34]
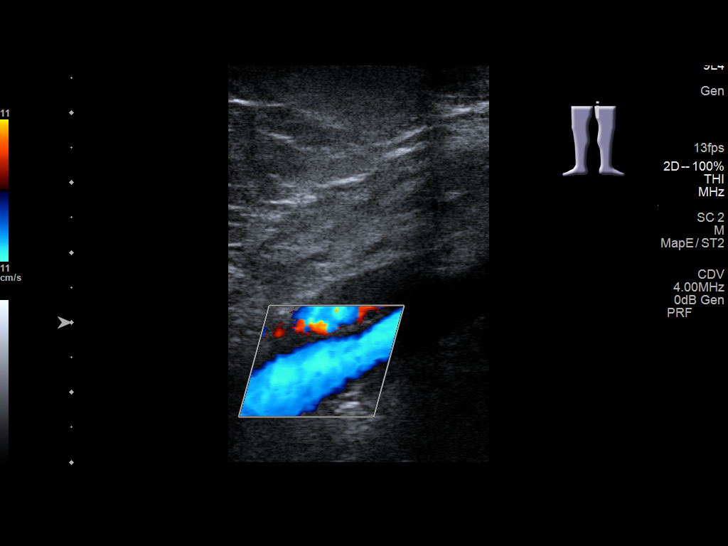
[im 9/34]
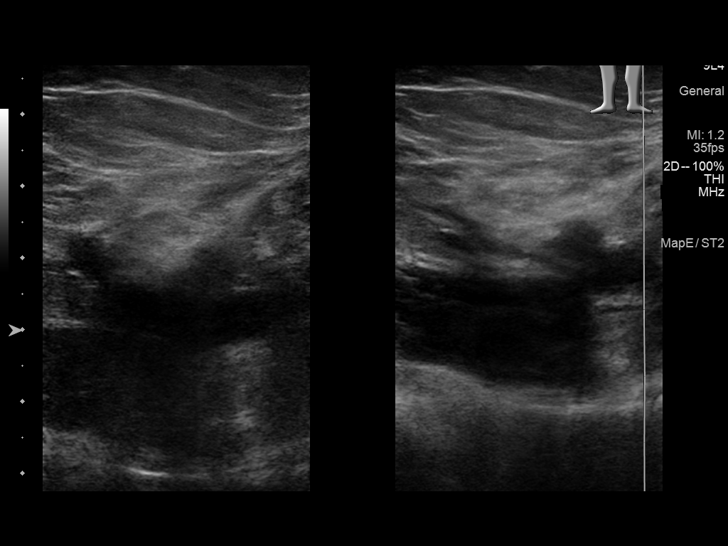
[im 12/34]
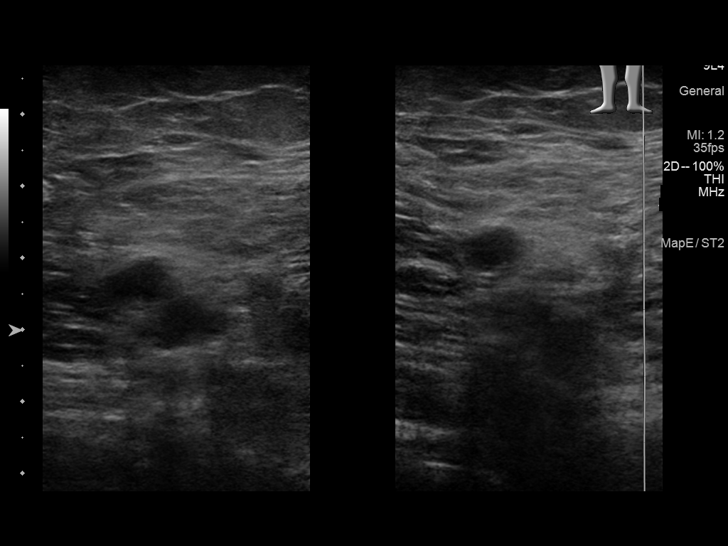
[im 15/34]
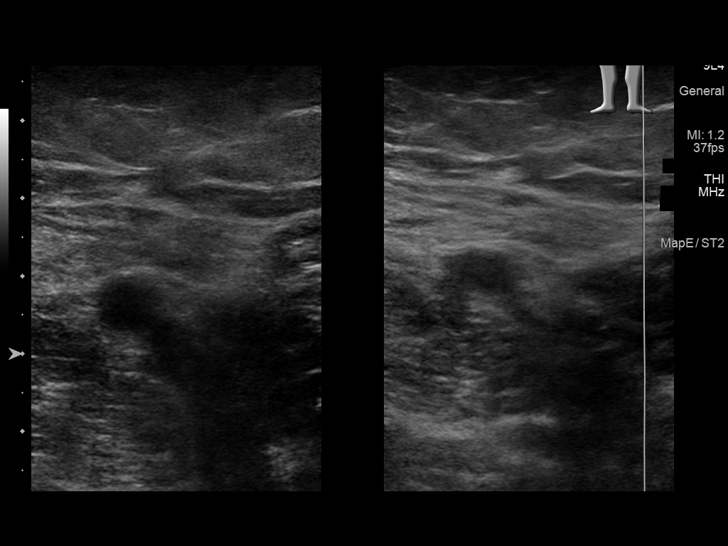
[im 18/34]
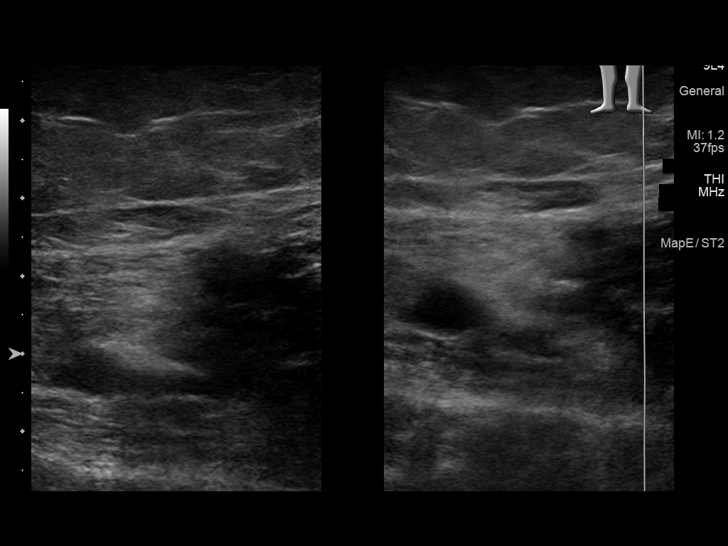
[im 19/34]
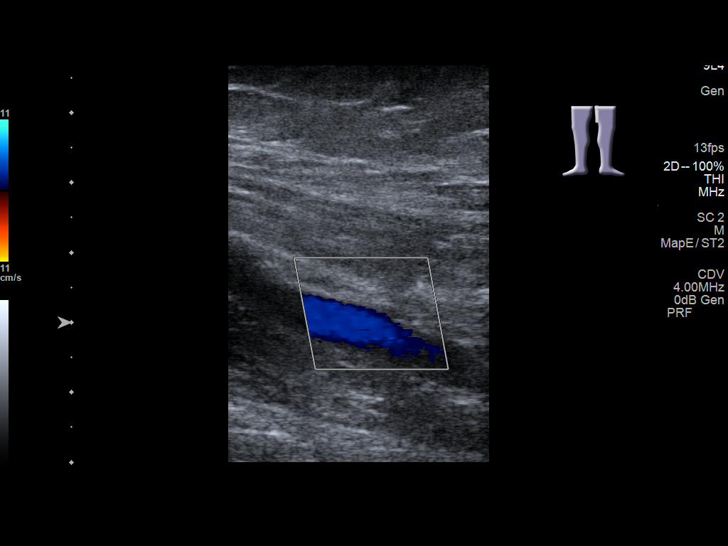
[im 22/34]
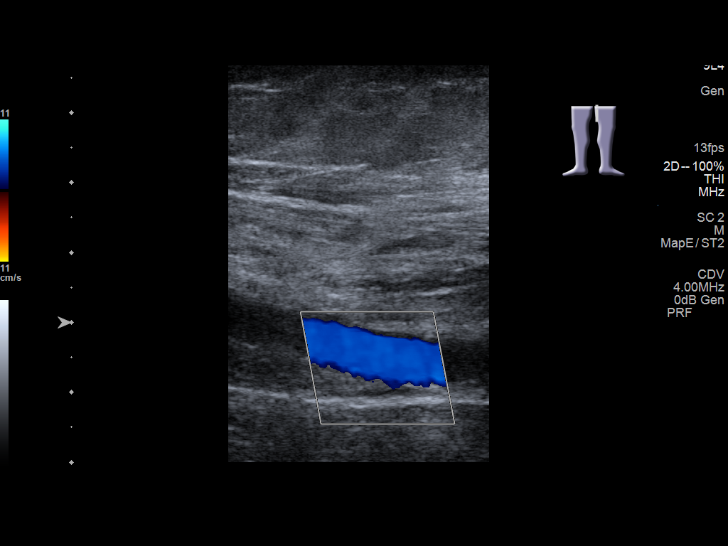
[im 25/34]
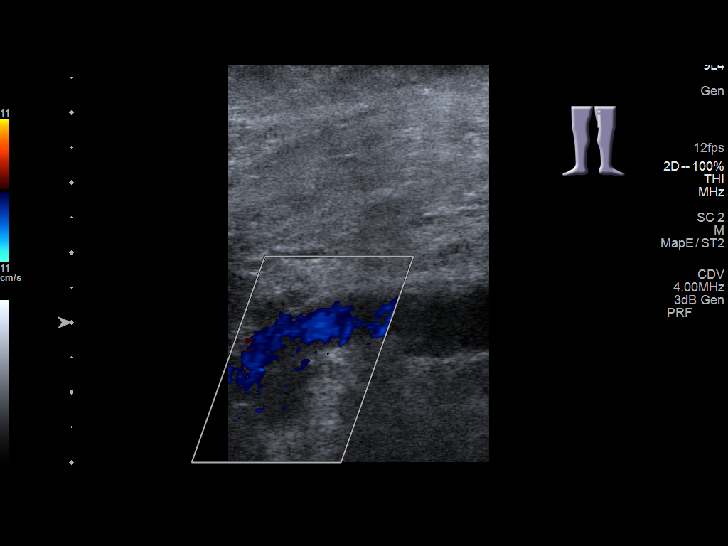
[im 28/34]
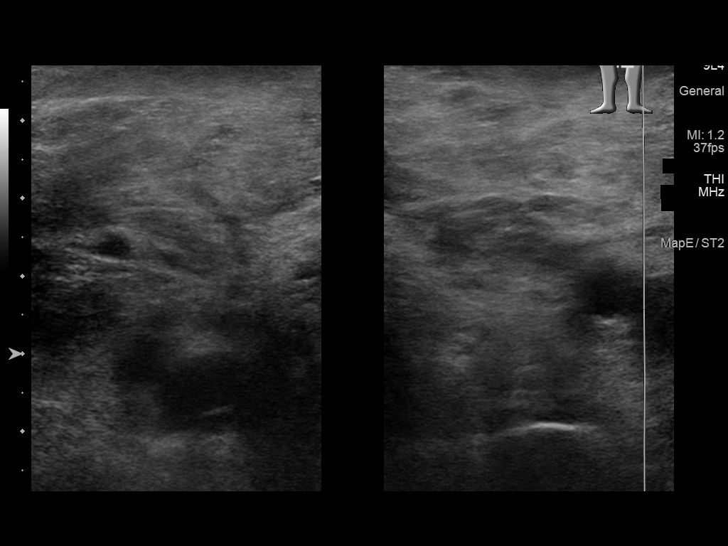
[im 31/34]
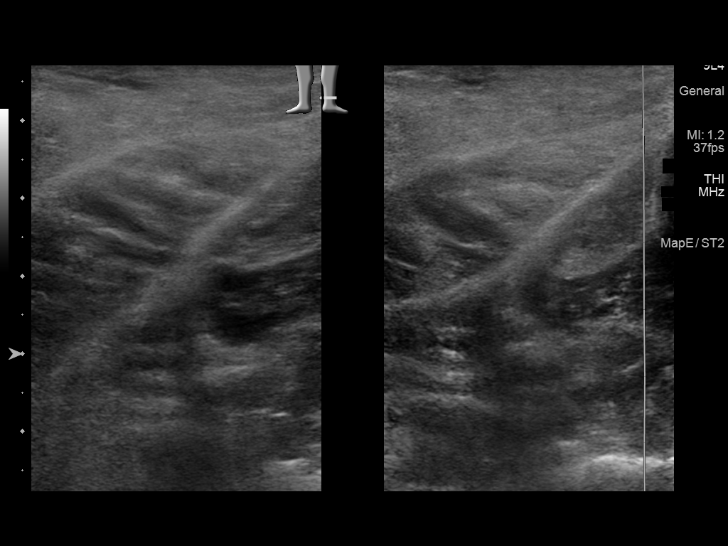
[im 34/34]
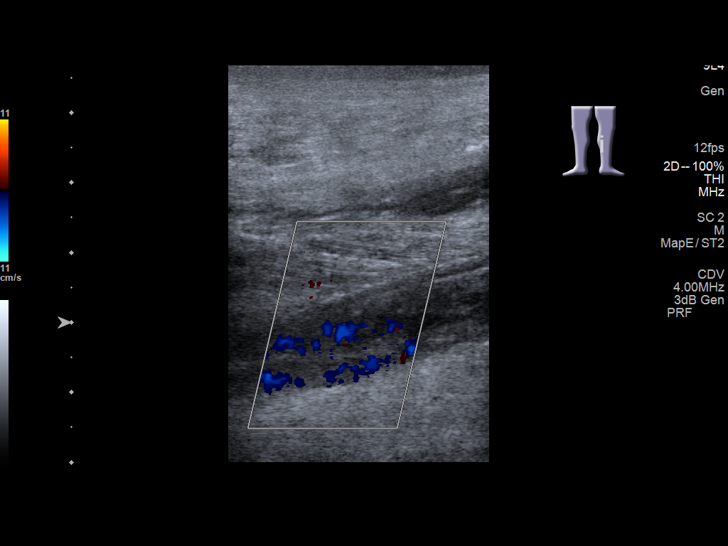

[13 of 24 positions shown; findings below may reference images not displayed]

FINDINGS: Contralateral Common Femoral Vein: Respiratory phasicity is normal
and symmetric with the symptomatic side. No evidence of thrombus.
Normal compressibility.

Common Femoral Vein: No evidence of thrombus. Normal
compressibility, respiratory phasicity and response to augmentation.

Saphenofemoral Junction: No evidence of thrombus. Normal
compressibility and flow on color Doppler imaging.

Profunda Femoral Vein: No evidence of thrombus. Normal
compressibility and flow on color Doppler imaging.

Femoral Vein: No evidence of thrombus. Normal compressibility,
respiratory phasicity and response to augmentation.

Popliteal Vein: No evidence of thrombus. Normal compressibility,
respiratory phasicity and response to augmentation.

Calf Veins: No evidence of thrombus. Normal compressibility and flow
on color Doppler imaging.

Superficial Great Saphenous Vein: No evidence of thrombus. Normal
compressibility.

Venous Reflux:  None.

Other Findings: No evidence of superficial thrombophlebitis or
abnormal fluid collection.
IMPRESSION: No evidence of left lower extremity deep venous thrombosis.
